# Patient Record
Sex: Female | Born: 2005
Health system: Southern US, Community
[De-identification: ages and names within clinical notes are randomized; demographics above are authoritative.]

---
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
## 2023-02-13 ENCOUNTER — Ambulatory Visit
Admission: RE | Admit: 2023-02-13 | Discharge: 2023-02-13 | Disposition: A | Discharge status: Home / Self Care | Payer: Commercial Managed Care - PPO | Source: Ambulatory Visit | Attending: Emergency Medicine | Admitting: Emergency Medicine

## 2023-02-13 VITALS — BP 107/72 | HR 89 | Temp 100.3°F | Resp 18

## 2023-02-13 DIAGNOSIS — R42 Dizziness and giddiness: Secondary | ICD-10-CM

## 2023-02-13 DIAGNOSIS — Z20822 Contact with and (suspected) exposure to covid-19: Secondary | ICD-10-CM

## 2023-02-13 DIAGNOSIS — J029 Acute pharyngitis, unspecified: Secondary | ICD-10-CM

## 2023-02-13 LAB — SARS CORONAVIRUS 2 BY RT PCR: SARS Coronavirus 2 by RT PCR: NEGATIVE

## 2023-02-13 LAB — GROUP A STREP BY PCR: Group A Strep by PCR: NOT DETECTED

## 2023-02-13 LAB — PREGNANCY, URINE: Preg Test, Ur: NEGATIVE

## 2023-02-13 MED ORDER — AEROCHAMBER MV MISC: 1 refills | Status: AC

## 2023-02-13 MED ORDER — ALUM & MAG HYDROXIDE-SIMETH 200-200-20 MG/5ML PO SUSP
15.0000 mL | Freq: Once | ORAL | Status: AC
  Administered 2023-02-13: 15 mL via ORAL

## 2023-02-13 MED ORDER — DIPHENHYDRAMINE HCL 12.5 MG/5ML PO ELIX
12.5000 mg | ORAL_SOLUTION | Freq: Once | ORAL | Status: AC
  Administered 2023-02-13: 12.5 mg via ORAL

## 2023-02-13 MED ORDER — NAPROXEN 500 MG PO TABS: 500.0000 mg | ORAL_TABLET | Freq: Two times a day (BID) | ORAL | 0 refills | Status: DC

## 2023-02-13 MED ORDER — ALBUTEROL SULFATE HFA 108 (90 BASE) MCG/ACT IN AERS: 1.0000 | INHALATION_SPRAY | RESPIRATORY_TRACT | 0 refills | Status: AC | PRN

## 2023-02-13 MED ORDER — FLUTICASONE PROPIONATE 50 MCG/ACT NA SUSP: 2.0000 | Freq: Every day | NASAL | 0 refills | Status: AC

## 2023-02-13 NOTE — ED Triage Notes (Signed)
Pt reports sore throat, HA, dizziness, x 1 day.  Took Tylenol approx 2 hrs ago.

## 2023-02-13 NOTE — Discharge Instructions (Signed)
We will contact you if your COVID comes back positive and we will talk about options.  Your strep and urine pregnancy is negative.  2 puffs from your albuterol inhaler using your spacer every 4-6 hours as needed for coughing, wheezing, shortness of breath.   1 gram of Tylenol and 500 mg of Naprosyn together twice a day as needed for pain.  Make sure you drink plenty of extra fluids.  Some people find salt water gargles and  Traditional Medicinal's "Throat Coat" tea helpful. Take 5 mL of liquid Benadryl and 5 mL of Maalox. Mix it together, and then hold it in your mouth for as long as you can and then swallow. You may do this 4 times a day.  Honey and lemon dissolved in hot water can also be soothing.  Make sure you drink plenty of electrolyte containing fluids.  Go to www.goodrx.com  or www.costplusdrugs.com to look up your medications. This will give you a list of where you can find your prescriptions at the most affordable prices. Or ask the pharmacist what the cash price is, or if they have any other discount programs available to help make your medication more affordable. This can be less expensive than what you would pay with insurance.

## 2023-02-13 NOTE — ED Provider Notes (Signed)
HPI  SUBJECTIVE:  Dawn Barker is a 17 y.o. female who presents with itchy, watery eyes, sore throat and sneezing, left sinus pain and pressure, body aches, nasal congestion, rhinorrhea, postnasal drip, coughing, wheezing, shortness of breath and mild photophobia starting yesterday.  She reports headache, intermittent episodes of feeling dizzy described as lightheadedness/off-balance starting today.  She states that she felt as if she almost "passed out" earlier today.  The dizziness lasts minutes.  No fevers, nausea, vomiting, diarrhea, abdominal pain, rash.  No palpitations, chest pain, pressure, heaviness.  No tinnitus, tunnel vision.  No syncope.  She reports decreased p.o. intake secondary to the sore throat.  She tried Tylenol with improvement in her symptoms.  She reports some shortness of breath with walking up stairs and worsening of her sore throat with swallowing and talking.  There are no aggravating or alleviating factors for the dizziness.  She has a past medical history of allergies, asthma, status post tonsillectomy and adenoidectomy.  No history of arrhythmia, WPW, syncope.  LMP: Last week.  Not sure if she could be pregnant.  PCP: [facility].   History reviewed. No pertinent past medical history.  Past Surgical History:  Procedure Laterality Date   TONSILLECTOMY AND ADENOIDECTOMY  2009    Family History  Problem Relation Age of Onset   Colon cancer Father 85       genetic testing-neg    Social History   Tobacco Use   Smoking status: Never   Smokeless tobacco: Never  Vaping Use   Vaping Use: Some days   Substances: Nicotine, Flavoring  Substance Use Topics   Alcohol use: Never   Drug use: Never    No current facility-administered medications for this encounter.  Current Outpatient Medications:    albuterol (VENTOLIN HFA) 108 (90 Base) MCG/ACT inhaler, Inhale 1-2 puffs into the lungs every 4 (four) hours as needed for wheezing or shortness of breath.,  Disp: 1 each, Rfl: 0   fluticasone (FLONASE) 50 MCG/ACT nasal spray, Place 2 sprays into both nostrils daily., Disp: 16 g, Rfl: 0   naproxen (NAPROSYN) 500 MG tablet, Take 1 tablet (500 mg total) by mouth 2 (two) times daily., Disp: 20 tablet, Rfl: 0   Spacer/Aero-Holding Chambers (AEROCHAMBER MV) inhaler, Use as instructed, Disp: 1 each, Rfl: 1  No Known Allergies   ROS  As noted in HPI.   Physical Exam  BP 107/72 (BP Location: Right Arm)   Pulse 89   Temp 100.3 F (37.9 C) (Oral)   Resp 18   LMP 02/06/2023 (Exact Date)   SpO2 96%   Constitutional: Well developed, well nourished, no acute distress Eyes:  EOMI, conjunctiva normal bilaterally HENT: Normocephalic, atraumatic,mucus membranes moist.  Left maxillary sinus tenderness.  Erythematous, swollen turbinates, worse on the left.  Mucoid nasal congestion.  Tonsils surgically absent.  Erythematous oropharynx.  Uvula midline.  No drooling, trismus, muffled voice. Neck: Positive anterior cervical lymphadenopathy Respiratory: Normal inspiratory effort, lungs clear bilaterally, good air movement Cardiovascular: Normal rate, regular rhythm, no murmurs GI: nondistended, soft,  no splenomegaly skin: No rash, skin intact Musculoskeletal: no deformities Neurologic: Alert & oriented x 3, no focal neuro deficits Psychiatric: Speech and behavior appropriate   ED Course   Medications  diphenhydrAMINE (BENADRYL) 12.5 MG/5ML elixir 12.5 mg (12.5 mg Oral Given 02/13/23 1431)  alum & mag hydroxide-simeth (MAALOX/MYLANTA) 200-200-20 MG/5ML suspension 15 mL (15 mLs Oral Given 02/13/23 1429)    Orders Placed This Encounter  Procedures   Group  A Strep by PCR    Standing Status:   Standing    Number of Occurrences:   1    Order Specific Question:   Patient immune status    Answer:   Normal    Order Specific Question:   Release to patient    Answer:   Immediate [1]   SARS Coronavirus 2 by RT PCR (hospital order, performed in [facility]  hospital lab) *cepheid single result test* Anterior Nasal Swab    Standing Status:   Standing    Number of Occurrences:   1   Pregnancy, urine    Standing Status:   Standing    Number of Occurrences:   1    Results for orders placed or performed during the hospital encounter of 02/13/23 (from the past 24 hour(s))  SARS Coronavirus 2 by RT PCR (hospital order, performed in [facility] hospital lab) *cepheid single result test* Anterior Nasal Swab     Status: None   Collection Time: 02/13/23  2:36 PM   Specimen: Anterior Nasal Swab  Result Value Ref Range   SARS Coronavirus 2 by RT PCR NEGATIVE NEGATIVE  Pregnancy, urine     Status: None   Collection Time: 02/13/23  2:36 PM  Result Value Ref Range   Preg Test, Ur NEGATIVE NEGATIVE   No results found.  ED Clinical Impression  1. Viral pharyngitis   2. Encounter for laboratory testing for COVID-19 virus   3. Intermittent lightheadedness      ED Assessment/Plan     Patient presents with sore throat, headache, and intermittent episodes of dizziness.  She denies chest pain, pressure, heaviness,.  Her heart is regular here.  She has no history of arrhythmia, Wolff-Parkinson-White, she denies syncope.  I suspect that her symptoms are from reduced p.o. intake secondary to sore throat.  Her asthma may also be acting up causing her shortness of breath.  Deferring EKG. giving Benadryl/Maalox here.  Urine pregnancy negative.  Strep PCR negative.  COVID pending.  Will contact patient at [phone] if COVID is positive.  COVID negative.  On reevaluation, she states that Benadryl/Maalox helped her throat significantly.  She has had no further episodes of dizziness/lightheadedness.  Presentation consistent with a viral pharyngitis/viral infection.  Home with Benadryl//Maalox, Tylenol/Naprosyn twice daily, push electrolyte containing fluids, will refill her albuterol inhaler with a spacer in case her coughing and wheezing get worse.   Flonase saline nasal irrigation Mucinex D.  Follow-up with PCP as needed.  ER return precautions given.  Work note for 2 days.  Discussed labs, MDM, treatment plan, and plan for follow-up with patient. Discussed sn/sx that should prompt return to the ED. patient agrees with plan.   Meds ordered this encounter  Medications   diphenhydrAMINE (BENADRYL) 12.5 MG/5ML elixir 12.5 mg   alum & mag hydroxide-simeth (MAALOX/MYLANTA) 200-200-20 MG/5ML suspension 15 mL   fluticasone (FLONASE) 50 MCG/ACT nasal spray    Sig: Place 2 sprays into both nostrils daily.    Dispense:  16 g    Refill:  0   naproxen (NAPROSYN) 500 MG tablet    Sig: Take 1 tablet (500 mg total) by mouth 2 (two) times daily.    Dispense:  20 tablet    Refill:  0   albuterol (VENTOLIN HFA) 108 (90 Base) MCG/ACT inhaler    Sig: Inhale 1-2 puffs into the lungs every 4 (four) hours as needed for wheezing or shortness of breath.    Dispense:  1 each  Refill:  0   Spacer/Aero-Holding Chambers (AEROCHAMBER MV) inhaler    Sig: Use as instructed    Dispense:  1 each    Refill:  1      *This clinic note was created using Dragon dictation software. Therefore, there may be occasional mistakes despite careful proofreading.  ?    Domenick Gong, MD 02/14/23 1407

## 2024-09-28 ENCOUNTER — Ambulatory Visit
Admission: EM | Admit: 2024-09-28 | Discharge: 2024-09-28 | Disposition: A | Discharge status: Home / Self Care | Attending: Family Medicine | Admitting: Family Medicine

## 2024-09-28 ENCOUNTER — Encounter: Payer: Self-pay | Admitting: Emergency Medicine

## 2024-09-28 DIAGNOSIS — N3001 Acute cystitis with hematuria: Secondary | ICD-10-CM | POA: Diagnosis present

## 2024-09-28 DIAGNOSIS — R300 Dysuria: Secondary | ICD-10-CM | POA: Insufficient documentation

## 2024-09-28 LAB — POCT URINE DIPSTICK
Bilirubin, UA: NEGATIVE
Glucose, UA: NEGATIVE mg/dL
Ketones, POC UA: NEGATIVE mg/dL
Nitrite, UA: NEGATIVE
POC PROTEIN,UA: NEGATIVE
Spec Grav, UA: 1.01
Urobilinogen, UA: 0.2 U/dL
pH, UA: 5.5

## 2024-09-28 MED ORDER — NITROFURANTOIN MONOHYD MACRO 100 MG PO CAPS: 100.0000 mg | ORAL_CAPSULE | Freq: Two times a day (BID) | ORAL | 0 refills | Status: AC

## 2024-09-28 MED ORDER — FLUCONAZOLE 150 MG PO TABS: 150.0000 mg | ORAL_TABLET | Freq: Once | ORAL | 0 refills | Status: AC

## 2024-09-28 NOTE — ED Provider Notes (Signed)
 " [facility]    CSN: [id number] Arrival date & time: 09/28/24  1236      History   Chief Complaint Chief Complaint  Patient presents with   Dysuria     HPI HPI Dawn Barker is a 19 y.o. female.    Darrelle B Helder presents for 1 day of dysuria urinary frequency with urgency and pelvic pain.  Tried nothing for her symptoms prior to arrival.  Has  not had any antibiotics in last 30 days.   Denies known STI exposure.  Dawn Barker does not use condoms regularly but has the same partner. She is  not currently pregnant.  Patient's last menstrual period was 09/21/2024 (approximate).  Reports history of recurrent urinary tract infections but has not had 1 in 3-1/2 years.   - Abnormal vaginal discharge: normal  - vaginal odor: no - vaginal bleeding: no - Dysuria: yes  - Hematuria: no - Urinary urgency: yes   - Urinary frequency: yes  - Fever: no - Abdominal pain: no  - Pelvic pain: yes  - Rash/Skin lesions/mouth ulcers: no - Nausea: no  - Vomiting: no  - Back Pain: not new        History reviewed. No pertinent past medical history.  Patient Active Problem List   Diagnosis Date Noted   Breast tenderness 11/13/2018    Past Surgical History:  Procedure Laterality Date   TONSILLECTOMY AND ADENOIDECTOMY  2009    OB History     Gravida  0   Para  0   Term  0   Preterm  0   AB  0   Living  0      SAB  0   IAB  0   Ectopic  0   Multiple  0   Live Births  0            Home Medications    Prior to Admission medications  Medication Sig Start Date End Date Taking? Authorizing Provider  fluconazole  (DIFLUCAN ) 150 MG tablet Take 1 tablet (150 mg total) by mouth once for 1 dose. 09/28/24 09/28/24 Yes Hilda Rynders, DO  nitrofurantoin , macrocrystal-monohydrate, (MACROBID ) 100 MG capsule Take 1 capsule (100 mg total) by mouth 2 (two) times daily. 09/28/24  Yes Arihant Pennings, DO  albuterol  (VENTOLIN  HFA) 108 (90 Base) MCG/ACT inhaler Inhale  1-2 puffs into the lungs every 4 (four) hours as needed for wheezing or shortness of breath. 02/13/23   Van Knee, MD  fluticasone  (FLONASE ) 50 MCG/ACT nasal spray Place 2 sprays into both nostrils daily. 02/13/23   Van Knee, MD  Spacer/Aero-Holding Chambers (AEROCHAMBER MV) inhaler Use as instructed 02/13/23   Van Knee, MD    Family History Family History  Problem Relation Age of Onset   Colon cancer Father 27       genetic testing-neg    Social History Social History[1]   Allergies   Patient has no known allergies.   Review of Systems Review of Systems: :negative unless otherwise stated in HPI.      Physical Exam Triage Vital Signs ED Triage Vitals  Encounter Vitals Group     BP 09/28/24 1301 117/75     Girls Systolic BP Percentile --      Girls Diastolic BP Percentile --      Boys Systolic BP Percentile --      Boys Diastolic BP Percentile --      Pulse Rate 09/28/24 1301 85     Resp 09/28/24 1301  14     Temp 09/28/24 1301 98 F (36.7 C)     Temp Source 09/28/24 1301 Oral     SpO2 09/28/24 1301 100 %     Weight 09/28/24 1259 158 lb (71.7 kg)     Height 09/28/24 1259 5' 3 (1.6 m)     Head Circumference --      Peak Flow --      Pain Score 09/28/24 1258 3     Pain Loc --      Pain Education --      Exclude from Growth Chart --    No data found.  Updated Vital Signs BP 117/75 (BP Location: Left Arm)   Pulse 85   Temp 98 F (36.7 C) (Oral)   Resp 14   Ht 5' 3 (1.6 m)   Wt 71.7 kg   LMP 09/21/2024 (Approximate)   SpO2 100%   BMI 27.99 kg/m   Visual Acuity Right Eye Distance:   Left Eye Distance:   Bilateral Distance:    Right Eye Near:   Left Eye Near:    Bilateral Near:     Physical Exam GEN: well appearing female in no acute distress  CVS: well perfused  RESP: speaking in full sentences without pause  ABD: soft, non-tender, non-distended, no palpable masses, no CVA tenderness    UC Treatments / Results   Labs (all labs ordered are listed, but only abnormal results are displayed) Labs Reviewed  POCT URINE DIPSTICK - Abnormal; Notable for the following components:      Result Value   Blood, UA moderate (*)    Leukocytes, UA Small (1+) (*)    All other components within normal limits  URINE CULTURE    EKG   Radiology No results found.  Procedures Procedures (including critical care time)  Medications Ordered in UC Medications - No data to display  Initial Impression / Assessment and Plan / UC Course  I have reviewed the triage vital signs and the nursing notes.  Pertinent labs & imaging results that were available during my care of the patient were reviewed by me and considered in my medical decision making (see chart for details).       Acute cystitis:  Patient is a 19 y.o. female  who presents for 1 days of dysuria and urinary frequency.  Overall patient is well-appearing and afebrile.  Vital signs stable.   POC urine dipstick consistent with acute cystitis.   Hematuria  present but no  microscopy available.   Treat with Macrobid  2 times daily for 5 days.  Urine culture obtained.  Follow-up sensitivities and change antibiotics, if needed. Diflucan  for antibiotic associated yeast infection prevention.   Return precautions including abdominal pain, fever, chills, nausea, or vomiting given. Follow-up,  if symptoms not improving or getting worse. Discussed MDM, treatment plan and plan for follow-up with patient who agrees with plan.        Final Clinical Impressions(s) / UC Diagnoses   Final diagnoses:  Dysuria  Acute cystitis with hematuria     Discharge Instructions      You have a urinary tract infection. I sent your urine for culture to be sure the antibiotic prescribed will treat your infection. Someone may call you to change antibiotics. Stop by the pharmacy to pick up your prescriptions.  Follow up with your primary care provider or return to the urgent care,  if not improving.   Take the diflucan  if you develop a yeast infection.  ED Prescriptions     Medication Sig Dispense Auth. Provider   nitrofurantoin , macrocrystal-monohydrate, (MACROBID ) 100 MG capsule Take 1 capsule (100 mg total) by mouth 2 (two) times daily. 10 capsule Christia Domke, DO   fluconazole  (DIFLUCAN ) 150 MG tablet Take 1 tablet (150 mg total) by mouth once for 1 dose. 1 tablet Kriste Berth, DO      PDMP not reviewed this encounter.     [1]  Social History Tobacco Use   Smoking status: Never   Smokeless tobacco: Never  Vaping Use   Vaping status: Some Days   Substances: Nicotine, Flavoring  Substance Use Topics   Alcohol use: Never   Drug use: Never     Kriste Berth, DO 09/28/24 1355  "

## 2024-09-28 NOTE — ED Triage Notes (Signed)
 Patient c/o dysuria and urinary frequency that started yesterday,.  Patient states that she did noticed a small amount of blood in her urine this morning. Patient denies fevers.

## 2024-09-28 NOTE — Discharge Instructions (Addendum)
 You have a urinary tract infection. I sent your urine for culture to be sure the antibiotic prescribed will treat your infection. Someone may call you to change antibiotics. Stop by the pharmacy to pick up your prescriptions.  Follow up with your primary care provider or return to the urgent care, if not improving.   Take the diflucan  if you develop a yeast infection.

## 2024-09-30 ENCOUNTER — Ambulatory Visit (HOSPITAL_COMMUNITY): Payer: Self-pay

## 2024-09-30 LAB — URINE CULTURE
Culture: 20000 — AB
Special Requests: NORMAL
